# Patient Record
Sex: Male | Born: 1990 | Race: White | Hispanic: No | Marital: Single | Smoking: Current every day smoker
Health system: Southern US, Community
[De-identification: ages and names within clinical notes are randomized; demographics above are authoritative.]

---

## 2014-06-04 ENCOUNTER — Encounter (HOSPITAL_BASED_OUTPATIENT_CLINIC_OR_DEPARTMENT_OTHER): Payer: Self-pay | Admitting: Emergency Medicine

## 2014-06-04 ENCOUNTER — Emergency Department (HOSPITAL_BASED_OUTPATIENT_CLINIC_OR_DEPARTMENT_OTHER)
Admission: EM | Admit: 2014-06-04 | Discharge: 2014-06-05 | Disposition: A | Discharge status: Home / Self Care | Attending: Emergency Medicine | Admitting: Emergency Medicine

## 2014-06-04 DIAGNOSIS — Z79899 Other long term (current) drug therapy: Secondary | ICD-10-CM | POA: Insufficient documentation

## 2014-06-04 DIAGNOSIS — Z720 Tobacco use: Secondary | ICD-10-CM | POA: Insufficient documentation

## 2014-06-04 DIAGNOSIS — R079 Chest pain, unspecified: Secondary | ICD-10-CM | POA: Diagnosis present

## 2014-06-04 DIAGNOSIS — R339 Retention of urine, unspecified: Secondary | ICD-10-CM | POA: Insufficient documentation

## 2014-06-04 NOTE — ED Notes (Signed)
Pt reports that he awoke this pm with sever chest pain that radiates to his left arm

## 2014-06-05 ENCOUNTER — Emergency Department (HOSPITAL_BASED_OUTPATIENT_CLINIC_OR_DEPARTMENT_OTHER)

## 2014-06-05 LAB — URINE MICROSCOPIC-ADD ON

## 2014-06-05 LAB — BASIC METABOLIC PANEL
Anion gap: 5 (ref 5–15)
BUN: 15 mg/dL (ref 6–23)
CALCIUM: 9.3 mg/dL (ref 8.4–10.5)
CO2: 29 mmol/L (ref 19–32)
Chloride: 109 mEq/L (ref 96–112)
Creatinine, Ser: 0.94 mg/dL (ref 0.50–1.35)
GFR calc non Af Amer: >90 mL/min (ref >90)
Glucose, Bld: 94 mg/dL (ref 70–99)
POTASSIUM: 3.9 mmol/L (ref 3.5–5.1)
Sodium: 143 mmol/L (ref 135–145)

## 2014-06-05 LAB — CBC WITH DIFFERENTIAL/PLATELET
BASOS PCT: 0 % (ref 0–1)
Basophils Absolute: 0 10*3/uL (ref 0.0–0.1)
Eosinophils Absolute: 0 10*3/uL (ref 0.0–0.7)
Eosinophils Relative: 0 % (ref 0–5)
HCT: 39.2 % (ref 39.0–52.0)
Hemoglobin: 12.6 g/dL — ABNORMAL LOW (ref 13.0–17.0)
LYMPHS PCT: 30 % (ref 12–46)
Lymphs Abs: 2.3 10*3/uL (ref 0.7–4.0)
MCH: 28.8 pg (ref 26.0–34.0)
MCHC: 32.1 g/dL (ref 30.0–36.0)
MCV: 89.5 fL (ref 78.0–100.0)
Monocytes Absolute: 0.6 10*3/uL (ref 0.1–1.0)
Monocytes Relative: 8 % (ref 3–12)
NEUTROS PCT: 62 % (ref 43–77)
Neutro Abs: 4.6 10*3/uL (ref 1.7–7.7)
Platelets: 230 10*3/uL (ref 150–400)
RBC: 4.38 MIL/uL (ref 4.22–5.81)
RDW: 12 % (ref 11.5–15.5)
WBC: 7.5 10*3/uL (ref 4.0–10.5)

## 2014-06-05 LAB — URINALYSIS, ROUTINE W REFLEX MICROSCOPIC
Bilirubin Urine: NEGATIVE
Glucose, UA: NEGATIVE mg/dL
HGB URINE DIPSTICK: NEGATIVE
Ketones, ur: NEGATIVE mg/dL
LEUKOCYTES UA: NEGATIVE
Nitrite: NEGATIVE
PROTEIN: NEGATIVE mg/dL
SPECIFIC GRAVITY, URINE: 1.019 (ref 1.005–1.030)
Urobilinogen, UA: 1 mg/dL (ref 0.0–1.0)
pH: 7.5 (ref 5.0–8.0)

## 2014-06-05 LAB — TROPONIN I

## 2014-06-05 MED ORDER — KETOROLAC TROMETHAMINE 60 MG/2ML IM SOLN
60.0000 mg | Freq: Once | INTRAMUSCULAR | Status: AC
  Administered 2014-06-05: 60 mg via INTRAMUSCULAR
  Filled 2014-06-05: qty 2

## 2014-06-05 NOTE — ED Notes (Signed)
Patient transported to X-ray 

## 2014-06-05 NOTE — ED Notes (Signed)
MD at bedside. 

## 2014-06-05 NOTE — Discharge Instructions (Signed)
Call [facility] on Monday to arrange a follow-up appointment.  Leave the Foley catheter in place until your urology appointment.  Continue Cipro as prescribed.   Acute Urinary Retention Acute urinary retention is the temporary inability to urinate. This is a common problem in older men. As men age their prostates become larger and block the flow of urine from the bladder. This is usually a problem that has come on gradually.  HOME CARE INSTRUCTIONS If you are sent home with a Foley catheter and a drainage system, you will need to discuss the best course of action with your health care provider. While the catheter is in, maintain a good intake of fluids. Keep the drainage bag emptied and lower than your catheter. This is so that contaminated urine will not flow back into your bladder, which could lead to a urinary tract infection. There are two main types of drainage bags. One is a large bag that usually is used at night. It has a good capacity that will allow you to sleep through the night without having to empty it. The second type is called a leg bag. It has a smaller capacity, so it needs to be emptied more frequently. However, the main advantage is that it can be attached by a leg strap and can go underneath your clothing, allowing you the freedom to move about or leave your home. Only take over-the-counter or prescription medicines for pain, discomfort, or fever as directed by your health care provider.  SEEK MEDICAL CARE IF:  You develop a low-grade fever.  You experience spasms or leakage of urine with the spasms. SEEK IMMEDIATE MEDICAL CARE IF:   You develop chills or fever.  Your catheter stops draining urine.  Your catheter falls out.  You start to develop increased bleeding that does not respond to rest and increased fluid intake. MAKE SURE YOU:  Understand these instructions.  Will watch your condition.  Will get help right away if you are not doing well or get  worse. Document Released: 08/20/2000 Document Revised: 05/19/2013 Document Reviewed: 10/23/2012 [facility] Patient Information 2015 [employer], [place]. This information is not intended to replace advice given to you by your health care provider. Make sure you discuss any questions you have with your health care provider.

## 2014-06-05 NOTE — ED Provider Notes (Signed)
CSN: [id number]     Arrival date & time 06/04/14  2332 History   First MD Initiated Contact with Patient 06/05/14 0118     Chief Complaint  Patient presents with  . Chest Pain     (Consider location/radiation/quality/duration/timing/severity/associated sxs/prior Treatment) HPI Comments: Patient is a 24 year old male with history of heroin abuse. He was incarcerated approximately one week ago and has not used since. He was brought tonight by jail guards for evaluation of chest pain and urinary retention. He states he started with sharp pains in the left side of his chest yesterday and this has worsened. He denies any shortness of breath, nausea, or diaphoresis. His pain is worse with deep breath, palpation, and movement.  He is also stating that he has been unable to void. He is apparently been straight [facility]. He was told there was a large amount of sediment in his urine and was started on antibiotic. He was sent here for evaluation of both of these complaints. He has no prior history of chest pains or urinary issues.  Patient is a 24 y.o. male presenting with chest pain. The history is provided by the patient.  Chest Pain Pain location:  L chest Pain quality: sharp   Pain radiates to:  L arm Pain radiates to the back: no   Pain severity:  Moderate Onset quality:  Sudden Duration:  2 days Timing:  Constant Progression:  Worsening Chronicity:  New Relieved by:  Nothing Worsened by:  Certain positions, deep breathing and movement   History reviewed. No pertinent past medical history. History reviewed. No pertinent past surgical history. History reviewed. No pertinent family history. History  Substance Use Topics  . Smoking status: Current Every Day Smoker  . Smokeless tobacco: Not on file  . Alcohol Use: No    Review of Systems  Cardiovascular: Positive for chest pain.  All other systems reviewed and are negative.     Allergies   Review of patient's allergies indicates no known allergies.  Home Medications   Prior to Admission medications   Medication Sig Start Date End Date Taking? Authorizing Provider  chlordiazePOXIDE (LIBRIUM) 25 MG capsule Take 50 mg by mouth 3 (three) times daily as needed for anxiety.    Yes Historical Provider, MD  ciprofloxacin (CIPRO) 250 MG tablet Take 250 mg by mouth 2 (two) times daily.   Yes Historical Provider, MD  meclizine (ANTIVERT) 25 MG tablet Take 25 mg by mouth 3 (three) times daily as needed for dizziness.   Yes Historical Provider, MD  tamsulosin (FLOMAX) 0.4 MG CAPS capsule Take 0.4 mg by mouth.   Yes Historical Provider, MD   BP 122/78 mmHg  Pulse 88  Temp(Src) 98.6 F (37 C) (Oral)  Resp 18  Ht 5\' 11"  (1.803 m)  Wt 150 lb (68.04 kg)  BMI 20.93 kg/m2  SpO2 100% Physical Exam  Constitutional: He is oriented to person, place, and time. He appears well-developed and well-nourished. No distress.  HENT:  Head: Normocephalic and atraumatic.  Mouth/Throat: Oropharynx is clear and moist.  Neck: Normal range of motion. Neck supple.  Cardiovascular: Normal rate, regular rhythm and normal heart sounds.   Pulmonary/Chest: Effort normal and breath sounds normal. No respiratory distress. He has no wheezes. He exhibits tenderness.  There is tenderness to palpation over the left lateral rib cage. There is no crepitus and no palpable abnormality.  Abdominal: Soft. Bowel sounds are normal. He exhibits no distension. There is no  tenderness.  Musculoskeletal: Normal range of motion. He exhibits no edema.  Lymphadenopathy:    He has no cervical adenopathy.  Neurological: He is alert and oriented to person, place, and time.  Skin: Skin is warm and dry. He is not diaphoretic.  Nursing note and vitals reviewed.   ED Course  Procedures (including critical care time) Labs Review Labs Reviewed  BASIC METABOLIC PANEL  CBC WITH DIFFERENTIAL  TROPONIN I  URINALYSIS, ROUTINE W REFLEX  MICROSCOPIC    Imaging Review No results found.   EKG Interpretation   Date/Time:  Friday June 04 2014 23:50:45 EST Ventricular Rate:  85 PR Interval:  156 QRS Duration: 82 QT Interval:  350 QTC Calculation: 416 R Axis:   84 Text Interpretation:  Normal sinus rhythm Normal ECG Confirmed by DELOS   MD, Antonella Upson ([id number]) on 06/05/2014 1:29:31 AM      MDM   Final diagnoses:  None    Patient is a 24 year old male who is currently an inmate at a local prison. He is brought for evaluation of urinary retention. He states he has required 3 straight caths today at the infirmary. He is now having left-sided chest pain which appears musculoskeletal. His pain is worse with movement and palpation, relieved with rest, and workup reveals no evidence for a cardiac etiology.  As he states he is unable to void, a Foley catheter was placed with only 200 mL of urine being obtained. There is no evidence for infection, however there are some amorphous urates and phosphates, the significance of which I'm uncertain. I will leave the Foley catheter in place and have the patient follow-up with urology next week to be rechecked. He is currently taking so which was prescribed by the jail infirmary.    Geoffery Lyons, MD 06/05/14 [phone]

## 2014-06-05 NOTE — ED Notes (Signed)
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

## 2015-12-29 IMAGING — CR DG CHEST 2V
2 series · 2 of 2 positions shown · non-contrast
Comparison: None.

CLINICAL DATA: Severe chest pain radiating to the left arm.
Difficulty urinating. Detox from heroin. Smoker.

EXAM:
CHEST  2 VIEW

[w chest pa]
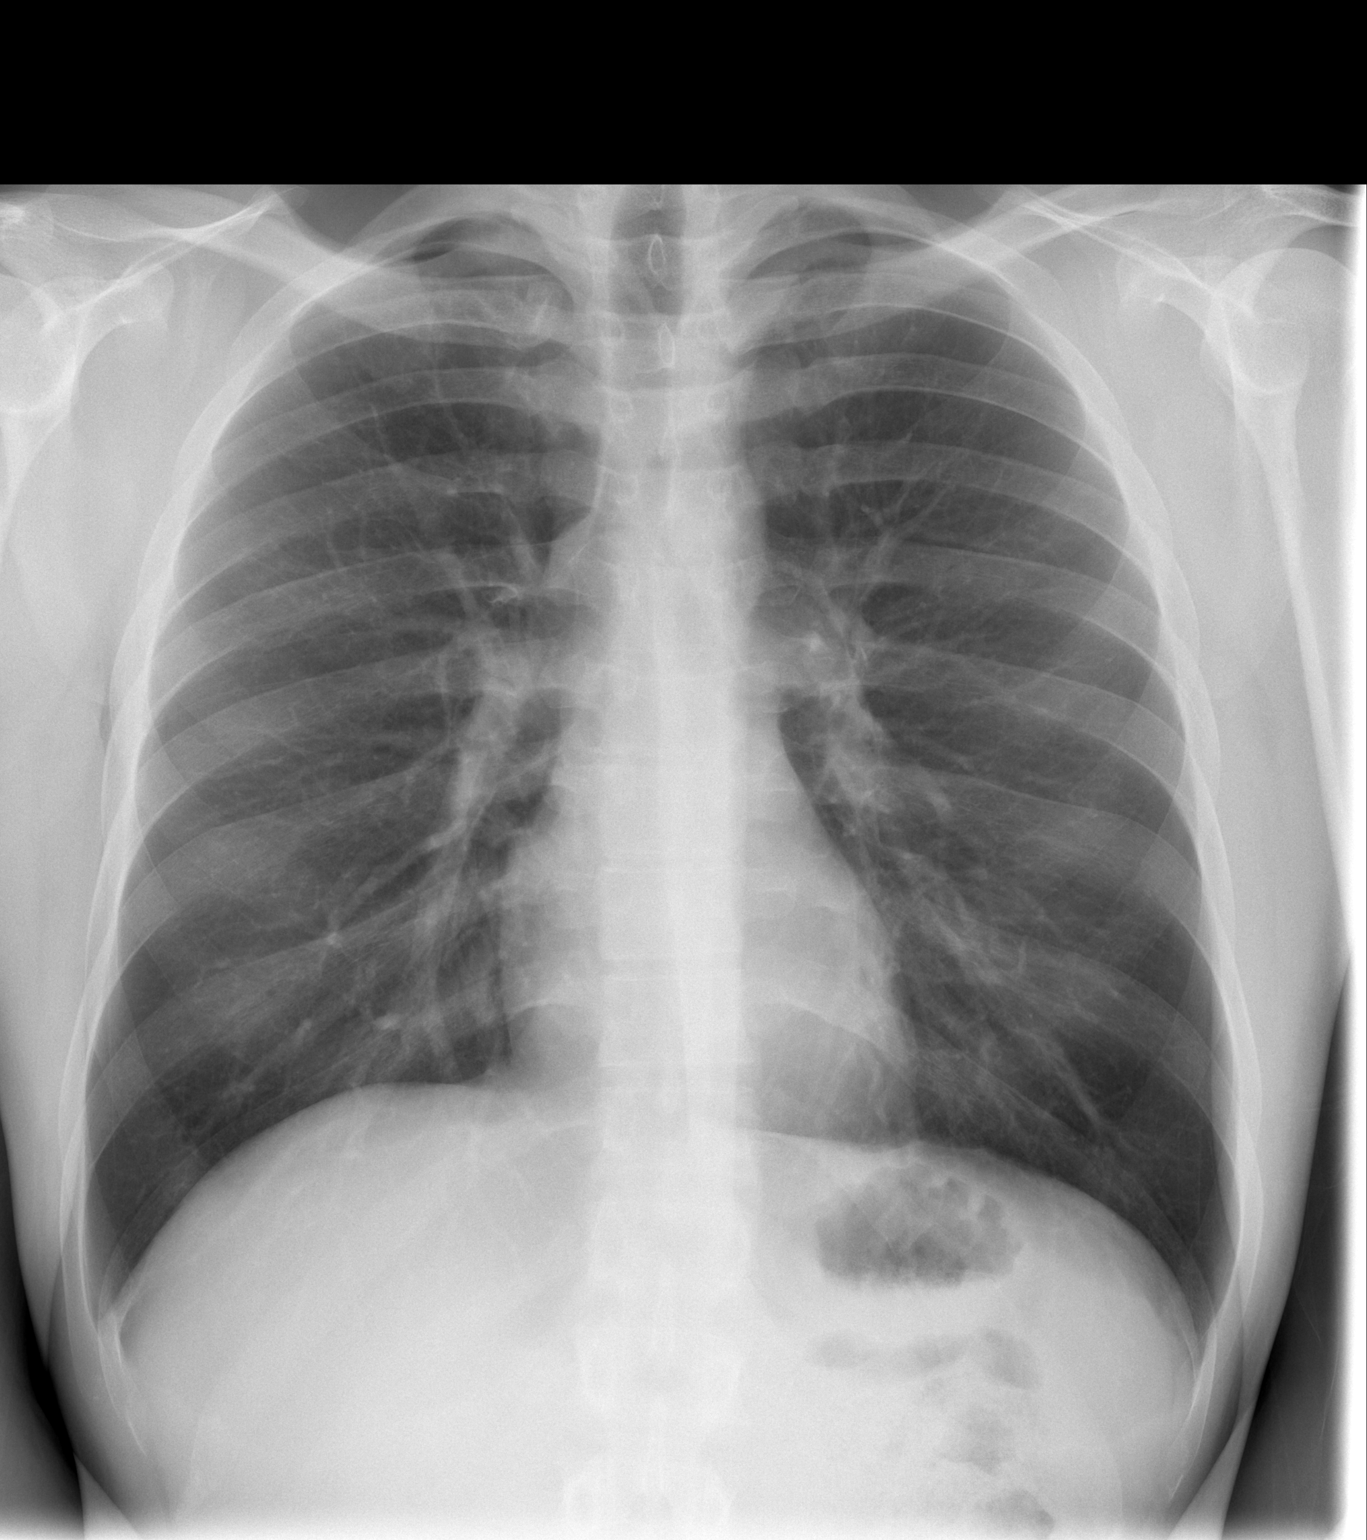

[w chest lat]
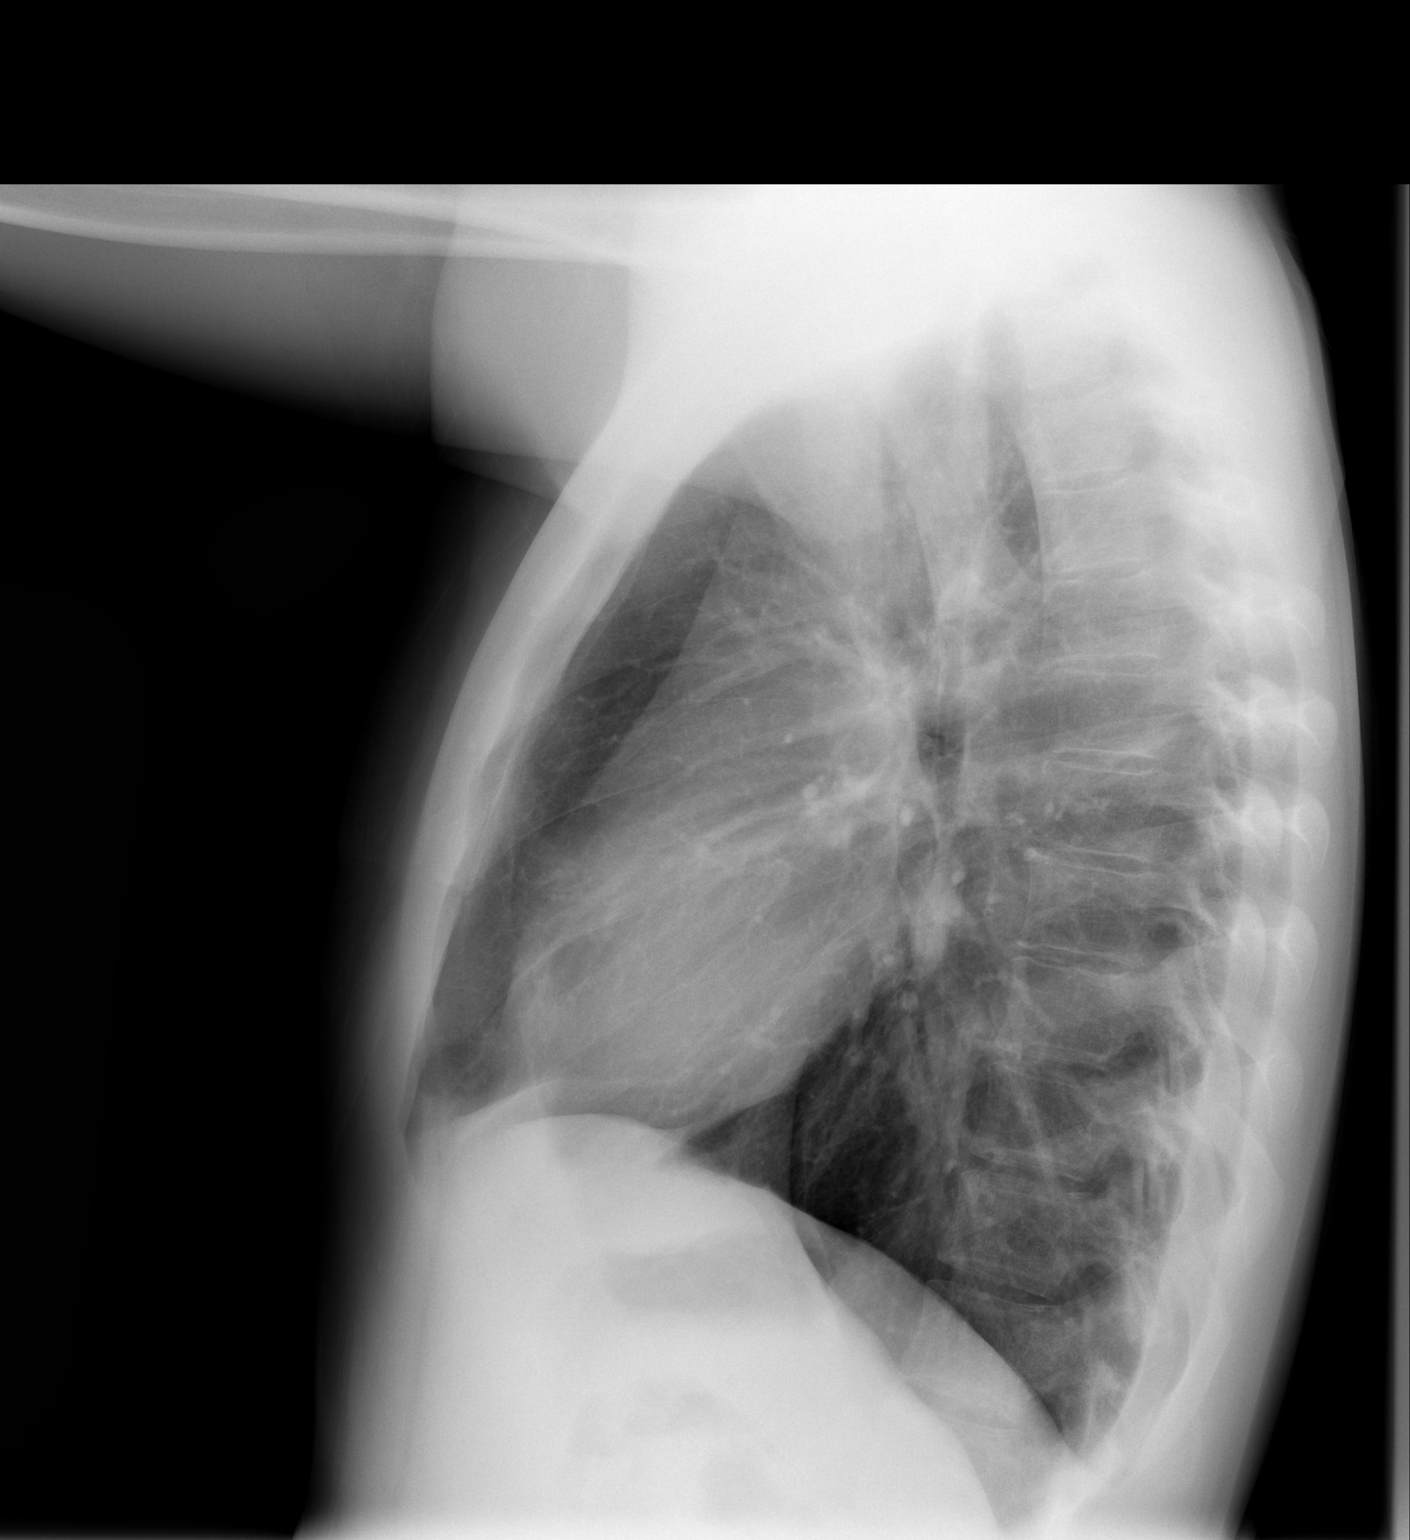

[2 of 2 positions shown; findings below may reference images not displayed]

FINDINGS: Pulmonary hyperinflation. The heart size and mediastinal contours
are within normal limits. Both lungs are clear. The visualized
skeletal structures are unremarkable.
IMPRESSION: No active cardiopulmonary disease.

## 2015-12-29 IMAGING — CT CT RENAL STONE PROTOCOL
2 of 4 series · 16 of 46 positions shown, 18 images · non-contrast
Comparison: None.

CLINICAL DATA: Incarcerated since 05/28/2014. Difficulty voiding.
Previously seen by [facility] and started on Cipro and Flomax. Catheter
dependent with dark sedimented urine. Abdominal and flank pain.
Symptoms since 05/22/2014.

EXAM:
CT ABDOMEN AND PELVIS WITHOUT CONTRAST
TECHNIQUE: Multidetector CT imaging of the abdomen and pelvis was performed
following the standard protocol without IV contrast.

[Series 2: renal stone < 200 lbs 5.0 b31f · axial · 0.65mm/px · z∈[-524,-64]mm · 13 of 102 slices shown, 15 images]
[im 5/102  soft-tissue]
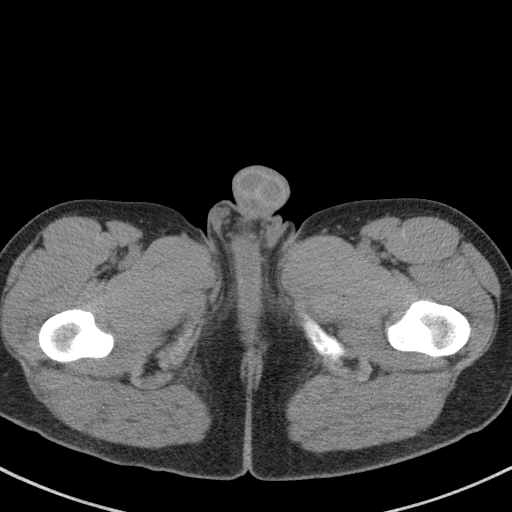
[im 5/102  bone]
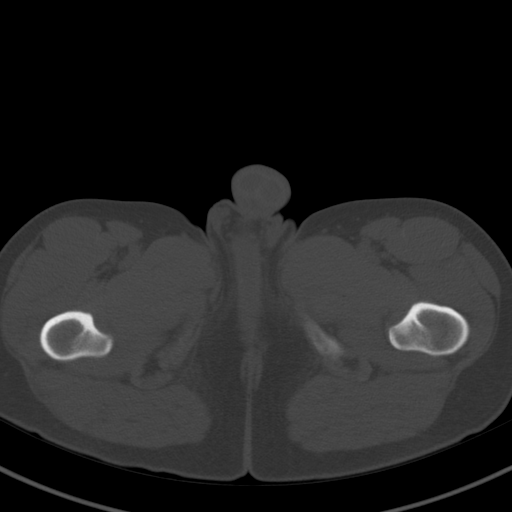
[im 14/102  soft-tissue]
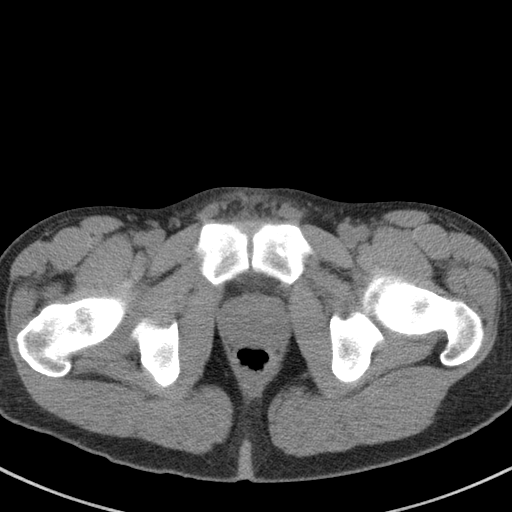
[im 22/102  soft-tissue]
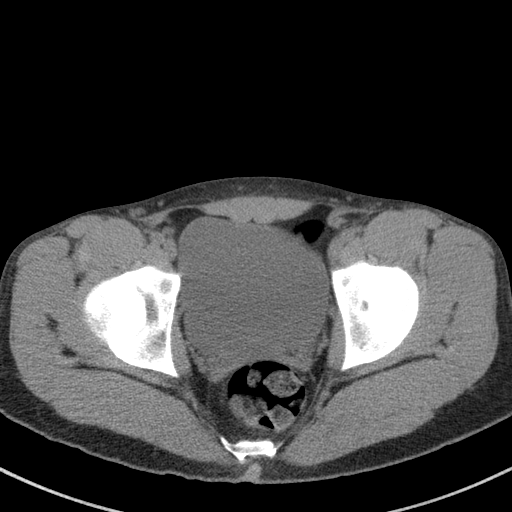
[im 27/102  soft-tissue]
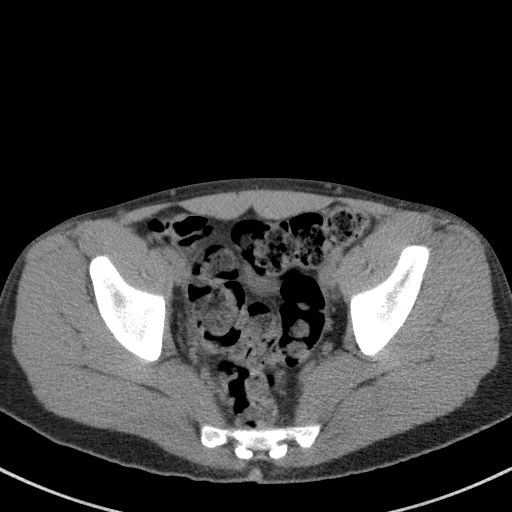
[im 36/102  soft-tissue]
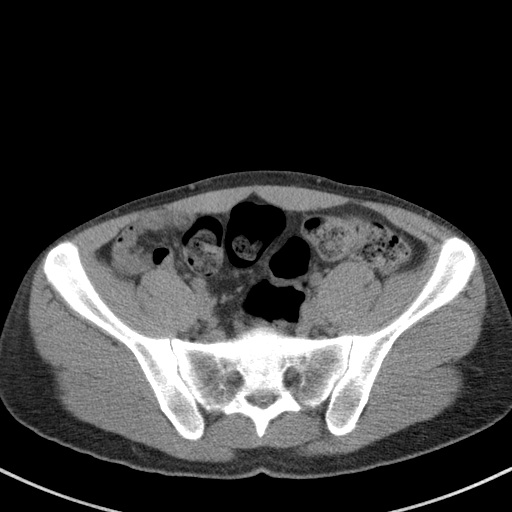
[im 44/102  soft-tissue]
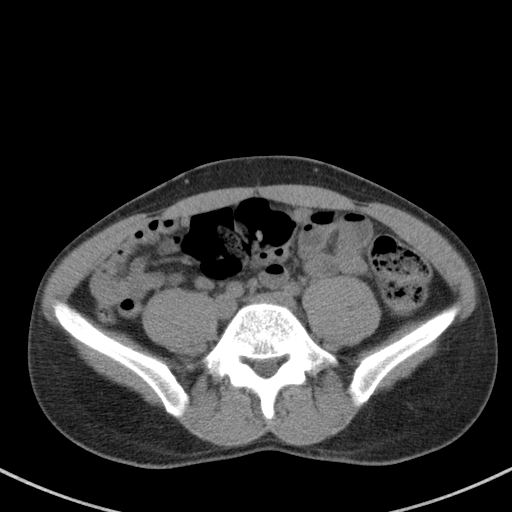
[im 53/102  soft-tissue]
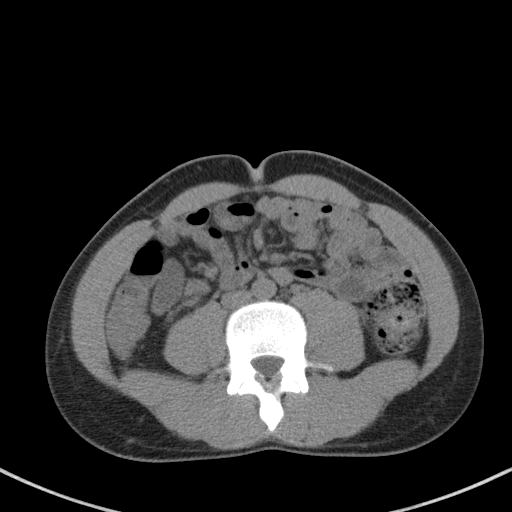
[im 58/102  soft-tissue]
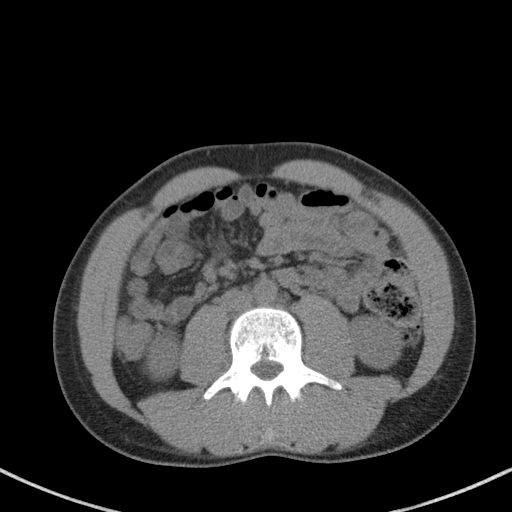
[im 66/102  soft-tissue]
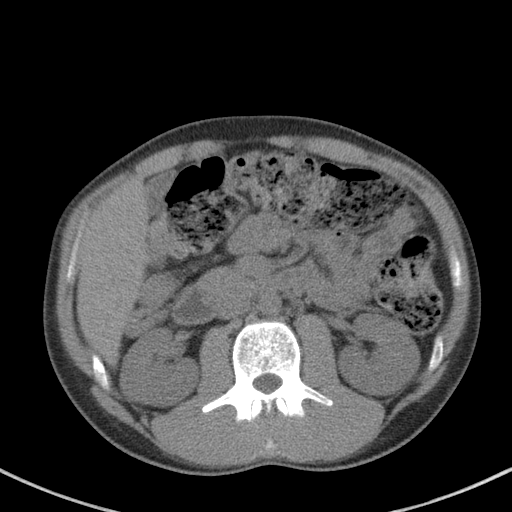
[im 66/102  bone]
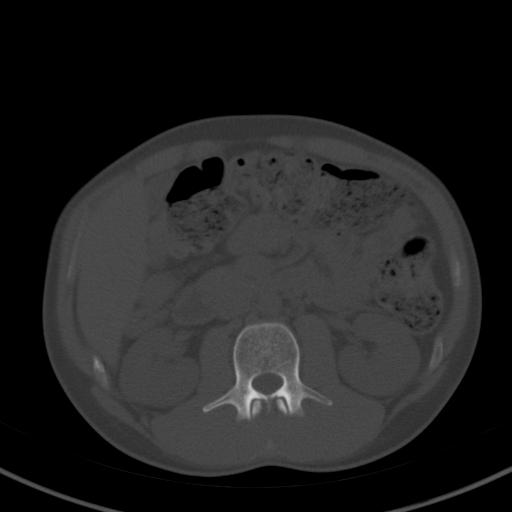
[im 75/102  soft-tissue]
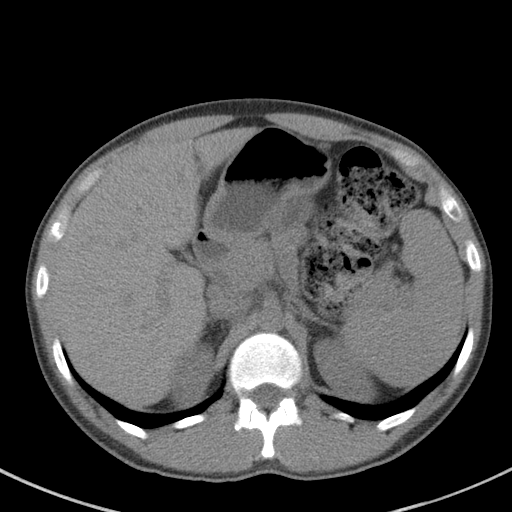
[im 80/102  soft-tissue]
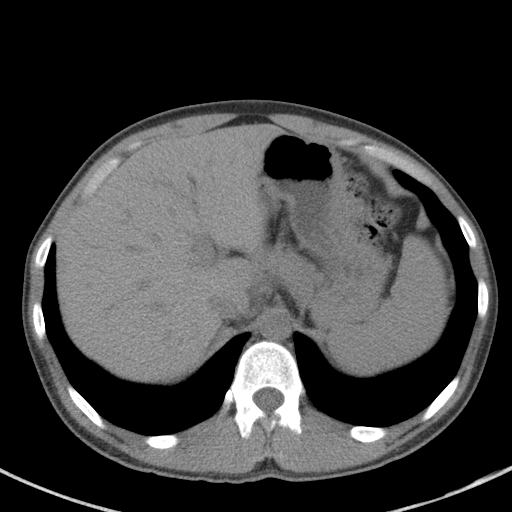
[im 88/102  soft-tissue]
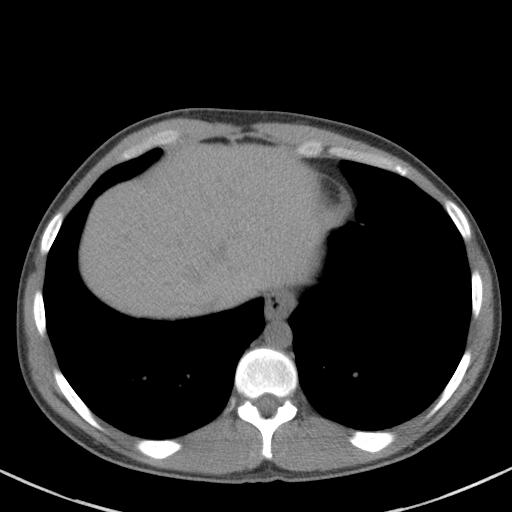
[im 97/102  soft-tissue]
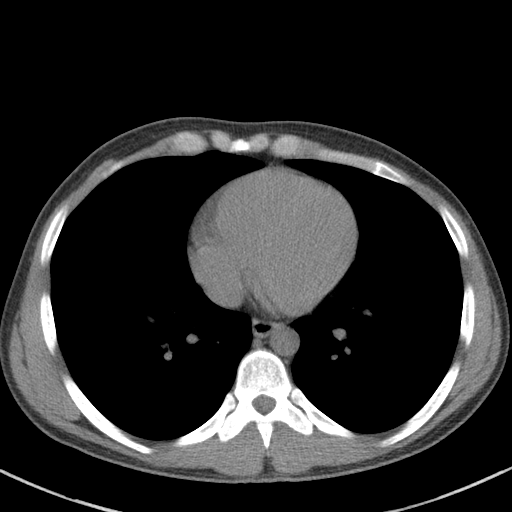

[Series 5: renal stone 3.0 coronal · coronal · 0.66mm/px · 3 of 78 slices shown]
[im 26/78  soft-tissue]
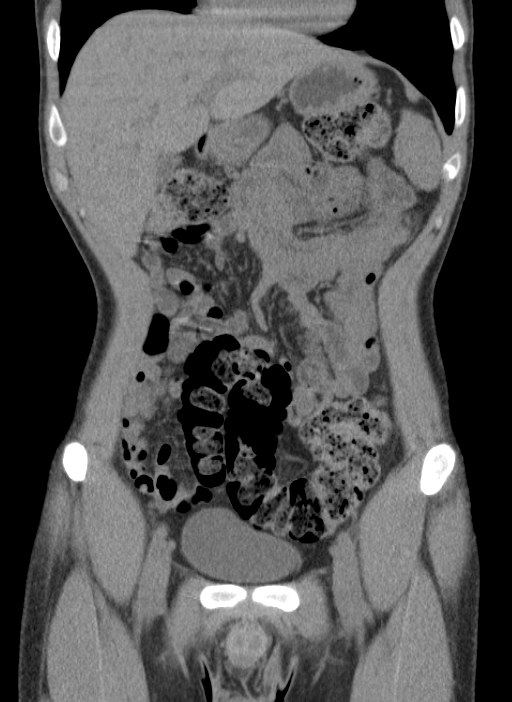
[im 35/78  soft-tissue]
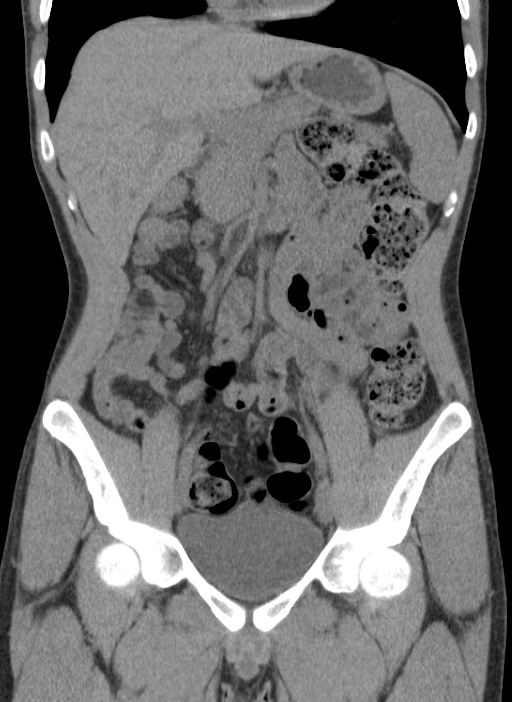
[im 43/78  soft-tissue]
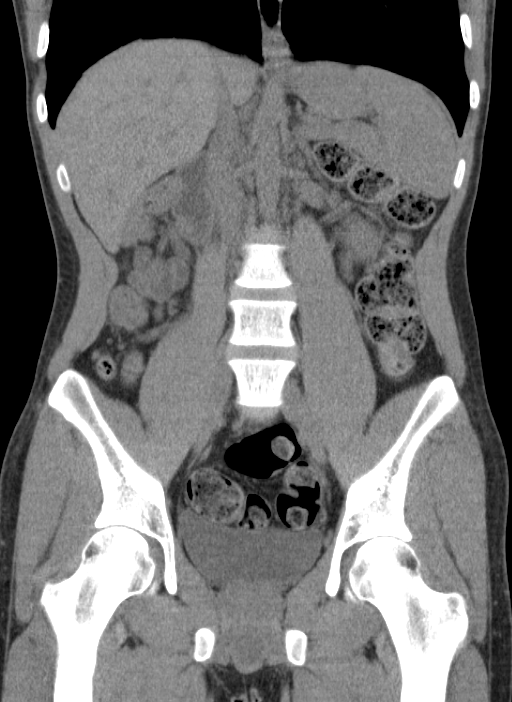

[16 of 46 positions shown; findings below may reference images not displayed]

FINDINGS: Lung bases are clear.

The unenhanced appearance of the liver, spleen, gallbladder,
pancreas, adrenal glands, kidneys, abdominal aorta, inferior vena
cava, and retroperitoneal lymph nodes is unremarkable. Stomach and
small bowel are decompressed. Diffusely stool-filled colon without
abnormal distention. No free air or free fluid in the abdomen.

Pelvis: Appendix is normal. The bladder is normally filled without
evidence of wall thickening. No intraluminal gas or filling defect
identified. Prostate gland is mildly enlarged, measuring 4.4 x
cm. No pelvic mass or lymphadenopathy. No free or loculated pelvic
fluid collections. Normal alignment of the lumbar spine. No
destructive bone lesions appreciated.
IMPRESSION: No evidence of significant bladder distention, wall thickening, or
gas. Mild enlargement of the prostate gland is nonspecific.
Unenhanced examination is otherwise unremarkable.

## 2021-07-20 ENCOUNTER — Encounter (HOSPITAL_BASED_OUTPATIENT_CLINIC_OR_DEPARTMENT_OTHER): Payer: Self-pay | Admitting: Emergency Medicine

## 2021-07-20 ENCOUNTER — Emergency Department (HOSPITAL_BASED_OUTPATIENT_CLINIC_OR_DEPARTMENT_OTHER)
Admission: EM | Admit: 2021-07-20 | Discharge: 2021-07-20 | Disposition: A | Discharge status: Home / Self Care | Attending: Emergency Medicine | Admitting: Emergency Medicine

## 2021-07-20 ENCOUNTER — Other Ambulatory Visit: Payer: Self-pay

## 2021-07-20 DIAGNOSIS — L0201 Cutaneous abscess of face: Secondary | ICD-10-CM

## 2021-07-20 DIAGNOSIS — H00031 Abscess of right upper eyelid: Secondary | ICD-10-CM | POA: Insufficient documentation

## 2021-07-20 LAB — COMPREHENSIVE METABOLIC PANEL
ALT: 202 U/L — ABNORMAL HIGH (ref 0–44)
AST: 90 U/L — ABNORMAL HIGH (ref 15–41)
Albumin: 4.2 g/dL (ref 3.5–5.0)
Alkaline Phosphatase: 59 U/L (ref 38–126)
Anion gap: 6 (ref 5–15)
BUN: 15 mg/dL (ref 6–20)
CO2: 25 mmol/L (ref 22–32)
Calcium: 9.2 mg/dL (ref 8.9–10.3)
Chloride: 104 mmol/L (ref 98–111)
Creatinine, Ser: 1.17 mg/dL (ref 0.61–1.24)
GFR, Estimated: >60 mL/min (ref >60)
Glucose, Bld: 92 mg/dL (ref 70–99)
Potassium: 3.7 mmol/L (ref 3.5–5.1)
Sodium: 135 mmol/L (ref 135–145)
Total Bilirubin: 0.9 mg/dL (ref 0.3–1.2)
Total Protein: 7.7 g/dL (ref 6.5–8.1)

## 2021-07-20 LAB — CBC WITH DIFFERENTIAL/PLATELET
Abs Immature Granulocytes: 0.04 10*3/uL (ref 0.00–0.07)
Basophils Absolute: 0 10*3/uL (ref 0.0–0.1)
Basophils Relative: 0 %
Eosinophils Absolute: 0.1 10*3/uL (ref 0.0–0.5)
Eosinophils Relative: 1 %
HCT: 46.5 % (ref 39.0–52.0)
Hemoglobin: 15.5 g/dL (ref 13.0–17.0)
Immature Granulocytes: 0 %
Lymphocytes Relative: 16 %
Lymphs Abs: 1.9 10*3/uL (ref 0.7–4.0)
MCH: 30.1 pg (ref 26.0–34.0)
MCHC: 33.3 g/dL (ref 30.0–36.0)
MCV: 90.3 fL (ref 80.0–100.0)
Monocytes Absolute: 1.3 10*3/uL — ABNORMAL HIGH (ref 0.1–1.0)
Monocytes Relative: 10 %
Neutro Abs: 8.9 10*3/uL — ABNORMAL HIGH (ref 1.7–7.7)
Neutrophils Relative %: 73 %
Platelets: 273 10*3/uL (ref 150–400)
RBC: 5.15 MIL/uL (ref 4.22–5.81)
RDW: 12.3 % (ref 11.5–15.5)
WBC: 12.2 10*3/uL — ABNORMAL HIGH (ref 4.0–10.5)
nRBC: 0 % (ref 0.0–0.2)

## 2021-07-20 LAB — URINALYSIS, ROUTINE W REFLEX MICROSCOPIC
Glucose, UA: 100 mg/dL — AB
Hgb urine dipstick: NEGATIVE
Ketones, ur: NEGATIVE mg/dL
Leukocytes,Ua: NEGATIVE
Nitrite: NEGATIVE
Protein, ur: NEGATIVE mg/dL
Specific Gravity, Urine: >=1.03 (ref 1.005–1.030)
pH: 6 (ref 5.0–8.0)

## 2021-07-20 MED ORDER — KETOROLAC TROMETHAMINE 30 MG/ML IJ SOLN
30.0000 mg | Freq: Once | INTRAMUSCULAR | Status: AC
  Administered 2021-07-20: 30 mg via INTRAMUSCULAR
  Filled 2021-07-20: qty 1

## 2021-07-20 MED ORDER — LIDOCAINE-EPINEPHRINE (PF) 2 %-1:200000 IJ SOLN
10.0000 mL | Freq: Once | INTRAMUSCULAR | Status: AC
  Administered 2021-07-20: 10 mL
  Filled 2021-07-20: qty 20

## 2021-07-20 NOTE — ED Provider Notes (Signed)
[facility] EMERGENCY DEPARTMENT Provider Note   CSN: [id number] Arrival date & time: 07/20/21  1115     History  Chief Complaint  Patient presents with   Eye Drainage    Rue Routon is a 31 y.o. male.  Aldie Soisson is a 31 y.o. male with prior history of hepatitis C and IV drug use, who presents to the emergency department with infection of the right eyebrow.  Patient reports 3 days ago he had a small pimple in the center of his eyebrow that his girlfriend tried to pop, they reported they did not get much drainage from it but then his eyebrow has become progressively more red and swollen with swelling now pushing the upper eyelid down.  He reports that he does not have drainage or pain to the eye itself but has pain surrounding the right eyebrow.  Reports blurred vision only because it is difficult to keep his eye open due to swelling.  Reports chills but has not had a measured fever.  No nausea or vomiting.  Reports prior history of abscesses.  Reports he has been clean from IV drug use for 3 years.  The history is provided by the patient and a significant other.      Home Medications Prior to Admission medications   Medication Sig Start Date End Date Taking? Authorizing Provider  chlordiazePOXIDE (LIBRIUM) 25 MG capsule Take 50 mg by mouth 3 (three) times daily as needed for anxiety.     [provider]  ciprofloxacin (CIPRO) 250 MG tablet Take 250 mg by mouth 2 (two) times daily.    [provider]  meclizine (ANTIVERT) 25 MG tablet Take 25 mg by mouth 3 (three) times daily as needed for dizziness.    [provider]  tamsulosin (FLOMAX) 0.4 MG CAPS capsule Take 0.4 mg by mouth.    [provider]      Allergies    Patient has no known allergies.    Review of Systems   Review of Systems  Constitutional:  Positive for chills. Negative for fever.  Eyes:  Positive for pain. Negative for discharge, redness and visual disturbance.        Swelling of the right eyebrow and surrounding tissue  Gastrointestinal:  Negative for nausea.  Skin:  Positive for color change.  Neurological:  Negative for headaches.  All other systems reviewed and are negative.  Physical Exam Updated Vital Signs BP 122/70    Pulse 91    Temp 98.3 F (36.8 C) (Oral)    Resp 16    Wt 72.6 kg    SpO2 100%    BMI 22.32 kg/m  Physical Exam Vitals and nursing note reviewed.  Constitutional:      General: He is not in acute distress.    Appearance: Normal appearance. He is well-developed. He is not ill-appearing or diaphoretic.  HENT:     Head: Normocephalic and atraumatic.     Mouth/Throat:     Mouth: Mucous membranes are moist.     Pharynx: Oropharynx is clear.  Eyes:     General:        Right eye: No discharge.        Left eye: No discharge.     Comments: Eye redness and swelling surrounding the eyebrow, there is a small scab just below the eyebrow, small amount of fluctuance palpable beneath scab with significant surrounding induration.  Swelling extending the down into the upper eyelid making it difficult for patient  to hold eye open, the eyelid itself is not indurated, eye without erythema or drainage.  PERRLA, EOMI, no lower eyelid swelling.  No proptosis (see photo below)  Pulmonary:     Effort: Pulmonary effort is normal. No respiratory distress.  Musculoskeletal:        General: No deformity.     Cervical back: Neck supple.  Skin:    General: Skin is warm and dry.  Neurological:     Mental Status: He is alert and oriented to person, place, and time.     Coordination: Coordination normal.  Psychiatric:        Mood and Affect: Mood normal.        Behavior: Behavior normal.     ED Results / Procedures / Treatments   Labs (all labs ordered are listed, but only abnormal results are displayed) Labs Reviewed  COMPREHENSIVE METABOLIC PANEL - Abnormal; Notable for the following components:      Result Value   AST 90 (*)    ALT 202  (*)    All other components within normal limits  CBC WITH DIFFERENTIAL/PLATELET - Abnormal; Notable for the following components:   WBC 12.2 (*)    Neutro Abs 8.9 (*)    Monocytes Absolute 1.3 (*)    All other components within normal limits  URINALYSIS, ROUTINE W REFLEX MICROSCOPIC - Abnormal; Notable for the following components:   Glucose, UA 100 (*)    Bilirubin Urine SMALL (*)    All other components within normal limits    EKG None  Radiology No results found.  Procedures Ultrasound ED Soft Tissue  Date/Time: 07/20/2021 2:38 PM Performed by: Jacqlyn Larsen, PA-C Authorized by: Jacqlyn Larsen, PA-C   Procedure details:    Indications: localization of abscess and evaluate for cellulitis     Transverse view:  Visualized   Longitudinal view:  Visualized   Images: not archived     Limitations:  Patient compliance Location:    Location: face     Side:  Right Findings:     abscess present    cellulitis present    Medications Ordered in ED Medications  lidocaine-EPINEPHrine (XYLOCAINE W/EPI) 2 %-1:200000 (PF) injection 10 mL (10 mLs Infiltration Given by Other 07/20/21 1314)  ketorolac (TORADOL) 30 MG/ML injection 30 mg (30 mg Intramuscular Given 07/20/21 1309)    ED Course/ Medical Decision Making/ A&P                           31 year old male presents with redness and swelling surrounding the right eyebrow and extending into the right upper eyelid.  Reports that he had a pimple in this area that they had attempted to pop, and then patient reports area has become increasingly red and swollen.  On arrival he is afebrile and vitals otherwise normal.  Does report prior history of IV drug use and hep C but is not currently using, denies injecting in this area.  Bedside ultrasound showed a small fluid collection just beneath the eyebrow with surrounding signs of cellulitis.  Labs: I have personally ordered, reviewed and interpreted lab work which shows mild  leukocytosis, patient also with mild transaminitis, known history of hepatitis C, no other electrolyte derangements.  Patient does not appear septic.  Plan for aspiration of fluid collection and antibiotics with close follow-up.  While preparing to do bedside aspiration notified by nursing staff that patient suddenly stormed out of the room and was refusing  any additional care, would not sign AMA paperwork or wait to speak with provider.  Patient eloped from the department prior to my knowledge.        Final Clinical Impression(s) / ED Diagnoses Final diagnoses:  Abscess of eyebrow     Rx / DC Orders ED Discharge Orders     None         Janet Berlin 07/20/21 1438    Isla Pence, MD 07/20/21 1517

## 2021-07-20 NOTE — ED Notes (Signed)
Patient decided he no longer wanted care. Patient stormed out - Patient declined to sign an AMA form. Patient ambulated out without difficulty.

## 2021-07-20 NOTE — ED Triage Notes (Signed)
Right eye brow infection , swelling and redness , unable to open eye . Eye drainage

## 2021-07-21 ENCOUNTER — Encounter (HOSPITAL_BASED_OUTPATIENT_CLINIC_OR_DEPARTMENT_OTHER): Payer: Self-pay | Admitting: Emergency Medicine

## 2021-07-21 ENCOUNTER — Emergency Department (HOSPITAL_BASED_OUTPATIENT_CLINIC_OR_DEPARTMENT_OTHER)
Admission: EM | Admit: 2021-07-21 | Discharge: 2021-07-21 | Disposition: A | Discharge status: Home / Self Care | Attending: Emergency Medicine | Admitting: Emergency Medicine

## 2021-07-21 DIAGNOSIS — L089 Local infection of the skin and subcutaneous tissue, unspecified: Secondary | ICD-10-CM | POA: Insufficient documentation

## 2021-07-21 MED ORDER — SULFAMETHOXAZOLE-TRIMETHOPRIM 800-160 MG PO TABS: 1.0000 | ORAL_TABLET | Freq: Two times a day (BID) | ORAL | 0 refills | Status: AC

## 2021-07-21 MED ORDER — OXYCODONE-ACETAMINOPHEN 5-325 MG PO TABS
2.0000 | ORAL_TABLET | Freq: Once | ORAL | Status: AC
  Administered 2021-07-21: 2 via ORAL
  Filled 2021-07-21: qty 2

## 2021-07-21 MED ORDER — SULFAMETHOXAZOLE-TRIMETHOPRIM 800-160 MG PO TABS
1.0000 | ORAL_TABLET | Freq: Once | ORAL | Status: AC
  Administered 2021-07-21: 1 via ORAL
  Filled 2021-07-21: qty 1

## 2021-07-21 NOTE — Discharge Instructions (Signed)
Please return if you decide to allow [place] to do an incision and drainage.

## 2021-07-21 NOTE — ED Provider Notes (Signed)
[facility] EMERGENCY DEPARTMENT Provider Note   CSN: [id number] Arrival date & time: 07/21/21  0134     History  Chief Complaint  Patient presents with   Facial Swelling    Tyler Romero is a 31 y.o. male.  31 year old male that presents to the emerged part today with swelling above his right eye.  Patient was seen earlier and he suggested I&D and antibiotics however patient eloped prior to any kind of treatment.  He returns now requesting antibiotics.  Still refuses to get an I&D or an aspiration done.  States he is use some warm compresses at home with some mild improvement in the swelling.  He is also try to express some of the pus himself and has not worked.  This is made the swelling worse as well.  No vision changes.  He can open his eye better than earlier today.       Home Medications Prior to Admission medications   Medication Sig Start Date End Date Taking? Authorizing Provider  sulfamethoxazole-trimethoprim (BACTRIM DS) 800-160 MG tablet Take 1 tablet by mouth 2 (two) times daily for 7 days. 07/21/21 07/28/21 Yes Karis Emig, Barbara Cower, MD      Allergies    Patient has no known allergies.    Review of Systems   Review of Systems  Physical Exam Updated Vital Signs BP 122/85 (BP Location: Right Arm)    Pulse (!) 104    Temp 98.4 F (36.9 C) (Oral)    Resp 18    Ht 5\' 11"  (1.803 m)    Wt 74.8 kg    SpO2 98%    BMI 23.01 kg/m  Physical Exam Vitals and nursing note reviewed.  Constitutional:      Appearance: He is well-developed.  HENT:     Head: Normocephalic.     Comments: Large area of erythema, swelling, induration to right eyebrow.  Has a scab over it.  No proptosis.  Does not extend below the eye. Cardiovascular:     Rate and Rhythm: Normal rate.  Pulmonary:     Effort: Pulmonary effort is normal. No respiratory distress.  Abdominal:     General: There is no distension.  Musculoskeletal:        General: No swelling or tenderness. Normal range of motion.      Cervical back: Normal range of motion.  Skin:    General: Skin is warm and dry.  Neurological:     Mental Status: He is alert.    ED Results / Procedures / Treatments   Labs (all labs ordered are listed, but only abnormal results are displayed) Labs Reviewed - No data to display  EKG None  Radiology No results found.  Procedures Procedures    Medications Ordered in ED Medications  sulfamethoxazole-trimethoprim (BACTRIM DS) 800-160 MG per tablet 1 tablet (has no administration in time range)  oxyCODONE-acetaminophen (PERCOCET/ROXICET) 5-325 MG per tablet 2 tablet (has no administration in time range)    ED Course/ Medical Decision Making/ A&P                           Medical Decision Making Risk Prescription drug management.   Once again tried to convince the patient to allow me to perform an incision and drainage however he still refuses.  I tried to compromise by doing a needle aspiration however he does not want that done either.  He states he will come back if the antibiotics do not  help. I discussed complications of orbital cellulitis, loss of vision, sepsis or other worsening condition that could lead to death if not treated appropriately. He accepted that risks and verbalized them back to his girlfriend at bedside when discussing his refusal.    Final Clinical Impression(s) / ED Diagnoses Final diagnoses:  Skin infection    Rx / DC Orders ED Discharge Orders          Ordered    sulfamethoxazole-trimethoprim (BACTRIM DS) 800-160 MG tablet  2 times daily        07/21/21 0232              Avonte Sensabaugh, Barbara Cower, MD 07/21/21 [phone]

## 2021-07-21 NOTE — ED Triage Notes (Signed)
Right eye brow infection.  Was here earlier but eloped.

## 2023-06-10 ENCOUNTER — Emergency Department (HOSPITAL_BASED_OUTPATIENT_CLINIC_OR_DEPARTMENT_OTHER)
Admission: EM | Admit: 2023-06-10 | Discharge: 2023-06-10 | Disposition: A | Discharge status: Home / Self Care | Payer: Medicaid Other | Attending: Emergency Medicine | Admitting: Emergency Medicine

## 2023-06-10 ENCOUNTER — Emergency Department (HOSPITAL_BASED_OUTPATIENT_CLINIC_OR_DEPARTMENT_OTHER): Payer: Medicaid Other

## 2023-06-10 ENCOUNTER — Encounter (HOSPITAL_BASED_OUTPATIENT_CLINIC_OR_DEPARTMENT_OTHER): Payer: Self-pay | Admitting: Emergency Medicine

## 2023-06-10 ENCOUNTER — Other Ambulatory Visit: Payer: Self-pay

## 2023-06-10 DIAGNOSIS — S81811A Laceration without foreign body, right lower leg, initial encounter: Secondary | ICD-10-CM | POA: Diagnosis not present

## 2023-06-10 DIAGNOSIS — W540XXA Bitten by dog, initial encounter: Secondary | ICD-10-CM | POA: Insufficient documentation

## 2023-06-10 DIAGNOSIS — S8991XA Unspecified injury of right lower leg, initial encounter: Secondary | ICD-10-CM | POA: Diagnosis present

## 2023-06-10 MED ORDER — HYDROCODONE-ACETAMINOPHEN 5-325 MG PO TABS: 1.0000 | ORAL_TABLET | ORAL | 0 refills | Status: AC | PRN

## 2023-06-10 MED ORDER — AMOXICILLIN-POT CLAVULANATE 875-125 MG PO TABS: 1.0000 | ORAL_TABLET | Freq: Two times a day (BID) | ORAL | 0 refills | Status: AC

## 2023-06-10 MED ORDER — LIDOCAINE-EPINEPHRINE (PF) 2 %-1:200000 IJ SOLN
INTRAMUSCULAR | Status: AC
  Administered 2023-06-10: 20 mL
  Filled 2023-06-10: qty 20

## 2023-06-10 MED ORDER — FENTANYL CITRATE PF 50 MCG/ML IJ SOSY
50.0000 ug | PREFILLED_SYRINGE | Freq: Once | INTRAMUSCULAR | Status: AC
  Administered 2023-06-10: 50 ug via INTRAMUSCULAR
  Filled 2023-06-10: qty 1

## 2023-06-10 NOTE — ED Provider Notes (Signed)
 [place] [facility] Provider Note   CSN: [id number] Arrival date & time: 06/10/23  1522     History  Chief Complaint  Patient presents with   Animal Bite    Tyler Romero is a 33 y.o. male.  HPI      33 year old male with no significant medical history presents with concern for dog bite to the right lower leg.  Reports he jumped over the fence to get a ball for his son when his neighbors pitbull bit him in the right leg.  He removed a large area of tissue.  Has pain to this area and above it.  Denies any other injuries.  He reports the dog is up-to-date on his rabies vaccination.  He is up-to-date on his tetanus shot.  He was able to bear weight following the injury.  Has some numbness immediately in the area surrounding it but no other distal numbness or pain.  He did take a Bactrim  that he had at home prior to arrival.  History reviewed. No pertinent past medical history.   Home Medications Prior to Admission medications   Medication Sig Start Date End Date Taking? Authorizing Provider  amoxicillin -clavulanate (AUGMENTIN ) 875-125 MG tablet Take 1 tablet by mouth 2 (two) times daily for 10 days. 06/10/23 06/20/23 Yes Dreama Longs, MD  HYDROcodone -acetaminophen  (NORCO/VICODIN) 5-325 MG tablet Take 1 tablet by mouth every 4 (four) hours as needed. 06/10/23  Yes Dreama Longs, MD      Allergies    Patient has no known allergies.    Review of Systems   Review of Systems  Physical Exam Updated Vital Signs BP 119/78 (BP Location: Left Arm)   Pulse (!) 118   Temp 97.8 F (36.6 C)   Resp 18   Wt 72.6 kg   SpO2 99%   BMI 22.32 kg/m  Physical Exam Vitals and nursing note reviewed.  Constitutional:      General: He is not in acute distress.    Appearance: Normal appearance. He is not ill-appearing, toxic-appearing or diaphoretic.  HENT:     Head: Normocephalic.  Eyes:     Conjunctiva/sclera: Conjunctivae normal.   Cardiovascular:     Rate and Rhythm: Normal rate and regular rhythm.     Pulses: Normal pulses.  Pulmonary:     Effort: Pulmonary effort is normal. No respiratory distress.  Musculoskeletal:        General: Tenderness (right calf and right hamstring, soft compartments, normal distal pulses and cap refill) present. No deformity or signs of injury.     Cervical back: No rigidity.  Skin:    General: Skin is warm and dry.     Coloration: Skin is not jaundiced or pale.     Comments: 7cmx 3.5cm area right calf through adipose down to but not involving fascia 2cm adjacent laceration, other 5mm puncture wound  Neurological:     General: No focal deficit present.     Mental Status: He is alert and oriented to person, place, and time.     ED Results / Procedures / Treatments   Labs (all labs ordered are listed, but only abnormal results are displayed) Labs Reviewed - No data to display  EKG None  Radiology DG Tibia/Fibula Right Result Date: 06/10/2023 CLINICAL DATA:  Dog bite to right lower leg. EXAM: RIGHT TIBIA AND FIBULA - 2 VIEW COMPARISON:  None Available. FINDINGS: There is no evidence of fracture or other focal bone lesions. External device overlies the distal  lower extremity. Mild soft tissue swelling. No radiopaque foreign body. IMPRESSION: No acute osseous abnormality. Electronically Signed   By: Harrietta Sherry M.D.   On: 06/10/2023 18:56    Procedures .Laceration Repair  Date/Time: 06/10/2023 5:20 PM  Performed by: Dreama Longs, MD Authorized by: Dreama Longs, MD   Consent:    Consent obtained:  Verbal   Consent given by:  Patient   Risks, benefits, and alternatives were discussed: yes     Risks discussed:  Infection, poor cosmetic result, poor wound healing, need for additional repair, pain and retained foreign body   Alternatives discussed:  No treatment Universal protocol:    Patient identity confirmed:  Verbally with patient Anesthesia:    Anesthesia  method:  Local infiltration   Local anesthetic:  Lidocaine  1% WITH epi Laceration details:    Location:  Leg   Leg location:  R lower leg   Length (cm):  7 Pre-procedure details:    Preparation:  Patient was prepped and draped in usual sterile fashion and imaging obtained to evaluate for foreign bodies Exploration:    Imaging obtained: x-ray     Wound exploration: wound explored through full range of motion     Wound extent: areolar tissue violated     Wound extent: no foreign body and no signs of injury     Contaminated: yes   Treatment:    Area cleansed with:  Chlorhexidine   Amount of cleaning:  Extensive   Irrigation solution:  Sterile water   Irrigation volume:  2L   Irrigation method:  Pressure wash   Debridement:  Minimal   Undermining:  None   Scar revision: no   Skin repair:    Repair method:  Sutures   Suture size:  3-0   Suture material:  Nylon   Suture technique:  Simple interrupted   Number of sutures:  5 Approximation:    Approximation:  Loose Repair type:    Repair type:  Simple     Medications Ordered in ED Medications  lidocaine -EPINEPHrine  (XYLOCAINE  W/EPI) 2 %-1:200000 (PF) injection (20 mLs  Given 06/10/23 1640)  fentaNYL  (SUBLIMAZE ) injection 50 mcg (50 mcg Intramuscular Given 06/10/23 1637)    ED Course/ Medical Decision Making/ A&P                                   33 year old male with no significant medical history presents with concern for dog bite to the right lower leg.   He is neurovascularly intact.  Has pain proximally, no sign of compartment syndrome, suspect this is likely muscular strain related to the bite when trying to get away.  XR without foreign body or fracture.  Discussed options of care, including healing by secondary intention versus loose closure given very large area affected.  Agreed to loose closure with discussion of the risks of infection.  Area was irrigated thoroughly, loosely closed.  Recommend continued close  monitoring for infection.  Given prescription for Augmentin .  Given large area, significant pain, will give short prescription for hydrocodone  with discussion of risks of addiction, constipation and somnolence.  Recommend return in 10-14 days for suture removal or to urgent care or PCP.  Recommend continued wound care. Patient discharged in stable condition with understanding of reasons to return.           Final Clinical Impression(s) / ED Diagnoses Final diagnoses:  Dog bite, initial encounter  Rx / DC Orders ED Discharge Orders          Ordered    amoxicillin -clavulanate (AUGMENTIN ) 875-125 MG tablet  2 times daily        06/10/23 1749    HYDROcodone -acetaminophen  (NORCO/VICODIN) 5-325 MG tablet  Every 4 hours PRN        06/10/23 1749              Dreama Longs, MD 06/11/23 1116

## 2023-06-10 NOTE — ED Triage Notes (Signed)
 Dog bite to right lower leg , neighbor dog .  Reports had rabies last year , sts don't want stitches either .

## 2023-06-10 NOTE — ED Notes (Signed)
 Patient transported to X-ray
